# Patient Record
Sex: Female | Born: 1995 | Race: White | Hispanic: No | Marital: Single | Smoking: Never smoker
Health system: Southern US, Community
[De-identification: ages and names within clinical notes are randomized; demographics above are authoritative.]

## PROBLEM LIST (undated history)

## (undated) DIAGNOSIS — F909 Attention-deficit hyperactivity disorder, unspecified type: Secondary | ICD-10-CM

---

## 2018-05-18 ENCOUNTER — Ambulatory Visit (HOSPITAL_COMMUNITY)
Admission: EM | Admit: 2018-05-18 | Discharge: 2018-05-18 | Disposition: A | Discharge status: Home / Self Care | Attending: Family Medicine | Admitting: Family Medicine

## 2018-05-18 ENCOUNTER — Encounter (HOSPITAL_COMMUNITY): Payer: Self-pay | Admitting: Emergency Medicine

## 2018-05-18 DIAGNOSIS — S00412A Abrasion of left ear, initial encounter: Secondary | ICD-10-CM | POA: Diagnosis not present

## 2018-05-18 HISTORY — DX: Attention-deficit hyperactivity disorder, unspecified type: F90.9

## 2018-05-18 NOTE — Discharge Instructions (Signed)
Rest and drink plenty of fluids Avoid cleaning ear for the next week to allow superficial cut to heal Use OTC ibuprofen and/ or tylenol as needed for pain control Follow up with PCP if symptoms persists Return here or go to the ER if you have any new or worsening symptoms ear pain, fever, chills, nausea, vomiting, runny nose, congestion, sore throat, cough, etc..Marland Kitchen

## 2018-05-18 NOTE — ED Provider Notes (Signed)
[facility]   [id number] 05/18/18 Arrival Time: 1153  CC: Blood in ear  SUBJECTIVE: History from: patient.  Megan Marshall is a 23 y.o. female who presents with of blood in left ear that she noticed this morning.  Denies a precipitating event, recent swimming, scuba diving, or traveling.  States she went to clean ear this morning and there was blood on the q-tip.  Denies pain.  Denies aggravating or alleviating factors.  Reports hx of ear infections and tubes in ear. Denies fever, chills, fatigue, sinus pain, rhinorrhea, ear discharge, sore throat, SOB, wheezing, chest pain, nausea, changes in bowel or bladder habits.    ROS: As per HPI.  Past Medical History:  Diagnosis Date  . ADHD    History reviewed. No pertinent surgical history. No Known Allergies No current facility-administered medications on file prior to encounter.    Current Outpatient Medications on File Prior to Encounter  Medication Sig Dispense Refill  . amphetamine-dextroamphetamine (ADDERALL XR) 10 MG 24 hr capsule Take by mouth.    Marland Kitchen. PARoxetine (PAXIL) 10 MG tablet Take by mouth.    . Sertraline HCl (ZOLOFT PO) Take by mouth.    Colleen Can. JUNEL 1.5/30 1.5-30 MG-MCG tablet TK 1 T PO QD    . levocetirizine (XYZAL) 5 MG tablet Take by mouth.     Social History   Socioeconomic History  . Marital status: Single    Spouse name: Not on file  . Number of children: Not on file  . Years of education: Not on file  . Highest education level: Not on file  Occupational History  . Not on file  Social Needs  . Financial resource strain: Not on file  . Food insecurity:    Worry: Not on file    Inability: Not on file  . Transportation needs:    Medical: Not on file    Non-medical: Not on file  Tobacco Use  . Smoking status: Never Smoker  Substance and Sexual Activity  . Alcohol use: Yes  . Drug use: Never  . Sexual activity: Not on file  Lifestyle  . Physical activity:    Days per week: Not on file    Minutes per  session: Not on file  . Stress: Not on file  Relationships  . Social connections:    Talks on phone: Not on file    Gets together: Not on file    Attends religious service: Not on file    Active member of club or organization: Not on file    Attends meetings of clubs or organizations: Not on file    Relationship status: Not on file  . Intimate partner violence:    Fear of current or ex partner: Not on file    Emotionally abused: Not on file    Physically abused: Not on file    Forced sexual activity: Not on file  Other Topics Concern  . Not on file  Social History Narrative  . Not on file   Family History  Problem Relation Age of Onset  . Healthy Mother   . Healthy Father     OBJECTIVE:  Vitals:   05/18/18 1209  BP: 118/83  Pulse: 92  Resp: 18  Temp: 98.3 F (36.8 C)  SpO2: 99%     General appearance: alert; well-appearing HEENT: Ears: RT EAC clear, LT EAC with small superficial abrasion visible in 10-2 o'clock position with scant dried blood, visible prior to otoscopic exam, EAC clear without erythema, swelling or  blood; TMs intact and pearly gray with visible cone of light; Eyes: PERRL, EOMI grossly; Nose: patent without rhinorrhea; Throat: oropharynx clear, tonsils not erythematous or enlarged, uvula midline Neck: supple without LAD Lungs: CTAB Heart: regular rate and rhythm.   Skin: warm and dry Psychological: alert and cooperative; normal mood and affect  ASSESSMENT & PLAN:  1. Abrasion of left ear canal, initial encounter    Rest and drink plenty of fluids Avoid cleaning ear for the next week to allow superficial cut to heal Use OTC ibuprofen and/ or tylenol as needed for pain control Follow up with PCP if symptoms persists Return here or go to the ER if you have any new or worsening symptoms ear pain, fever, chills, nausea, vomiting, runny nose, congestion, sore throat, cough, etc...  Reviewed expectations re: course of current medical issues. Questions  answered. Outlined signs and symptoms indicating need for more acute intervention. Patient verbalized understanding. After Visit Summary given.         Rennis Harding, PA-C 05/18/18 1256

## 2018-05-18 NOTE — ED Triage Notes (Signed)
Pt c/o dried blood in her L ear with ear fullness.

## 2019-06-18 ENCOUNTER — Emergency Department (HOSPITAL_COMMUNITY): Payer: BC Managed Care – PPO

## 2019-06-18 ENCOUNTER — Encounter (HOSPITAL_COMMUNITY): Payer: Self-pay

## 2019-06-18 ENCOUNTER — Emergency Department (HOSPITAL_COMMUNITY)
Admission: EM | Admit: 2019-06-18 | Discharge: 2019-06-18 | Disposition: A | Discharge status: Home / Self Care | Payer: BC Managed Care – PPO | Attending: Emergency Medicine | Admitting: Emergency Medicine

## 2019-06-18 ENCOUNTER — Other Ambulatory Visit: Payer: Self-pay

## 2019-06-18 DIAGNOSIS — R0602 Shortness of breath: Secondary | ICD-10-CM | POA: Diagnosis present

## 2019-06-18 DIAGNOSIS — R0781 Pleurodynia: Secondary | ICD-10-CM | POA: Diagnosis not present

## 2019-06-18 LAB — BASIC METABOLIC PANEL
Anion gap: 7 (ref 5–15)
BUN: 9 mg/dL (ref 6–20)
CO2: 24 mmol/L (ref 22–32)
Calcium: 9 mg/dL (ref 8.9–10.3)
Chloride: 105 mmol/L (ref 98–111)
Creatinine, Ser: 0.91 mg/dL (ref 0.44–1.00)
GFR calc Af Amer: >60 mL/min (ref >60)
GFR calc non Af Amer: >60 mL/min (ref >60)
Glucose, Bld: 100 mg/dL — ABNORMAL HIGH (ref 70–99)
Potassium: 3.7 mmol/L (ref 3.5–5.1)
Sodium: 136 mmol/L (ref 135–145)

## 2019-06-18 LAB — CBC WITH DIFFERENTIAL/PLATELET
Abs Immature Granulocytes: 0.01 10*3/uL (ref 0.00–0.07)
Basophils Absolute: 0 10*3/uL (ref 0.0–0.1)
Basophils Relative: 0 %
Eosinophils Absolute: 0 10*3/uL (ref 0.0–0.5)
Eosinophils Relative: 0 %
HCT: 40.7 % (ref 36.0–46.0)
Hemoglobin: 13.7 g/dL (ref 12.0–15.0)
Immature Granulocytes: 0 %
Lymphocytes Relative: 31 %
Lymphs Abs: 1.8 10*3/uL (ref 0.7–4.0)
MCH: 29.3 pg (ref 26.0–34.0)
MCHC: 33.7 g/dL (ref 30.0–36.0)
MCV: 87.2 fL (ref 80.0–100.0)
Monocytes Absolute: 0.5 10*3/uL (ref 0.1–1.0)
Monocytes Relative: 8 %
Neutro Abs: 3.5 10*3/uL (ref 1.7–7.7)
Neutrophils Relative %: 61 %
Platelets: 215 10*3/uL (ref 150–400)
RBC: 4.67 MIL/uL (ref 3.87–5.11)
RDW: 12.5 % (ref 11.5–15.5)
WBC: 5.8 10*3/uL (ref 4.0–10.5)
nRBC: 0 % (ref 0.0–0.2)

## 2019-06-18 LAB — I-STAT BETA HCG BLOOD, ED (MC, WL, AP ONLY): I-stat hCG, quantitative: <5 m[IU]/mL (ref <5)

## 2019-06-18 MED ORDER — IOHEXOL 350 MG/ML SOLN
100.0000 mL | Freq: Once | INTRAVENOUS | Status: AC | PRN
  Administered 2019-06-18: 100 mL via INTRAVENOUS

## 2019-06-18 MED ORDER — LORAZEPAM 2 MG/ML IJ SOLN: 1.0000 mg | Freq: Once | INTRAMUSCULAR | Status: DC

## 2019-06-18 MED ORDER — SODIUM CHLORIDE (PF) 0.9 % IJ SOLN
INTRAMUSCULAR | Status: AC
  Filled 2019-06-18: qty 50

## 2019-06-18 NOTE — ED Triage Notes (Signed)
Patient arrived stating two weeks ago her provider found a small blood clot in her left leg and she has been on a blood thinner since. States some shortness of breath but today when taking a deep breath she has had a sharp pinching pain under her left breast.

## 2019-06-18 NOTE — ED Provider Notes (Signed)
[facility] DEPT Provider Note   CSN: [id number] Arrival date & time: 06/18/19  2876     History Chief Complaint  Patient presents with  . Shortness of Breath    Megan Marshall is a 24 y.o. female.  HPI   24 year old female with chest pain.  Pleuritic.  Underneath her left breast.  Noticed this morning.  Feels sharp pain and "pinching" with deep and  moderately deep" breathing.  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  She is started on Xarelto.  She reports compliance with it.  She is concerned that she may potentially have a pulmonary embolism.  Mild shortness of breath.  She states that her prior leg pain has improved since being on the Xarelto.  She is on oral contraceptives.  She was not advised to stop these though.  She has an appointment with hematology later in the month.  She also cites a family history of some type of coagulopathy.  Apparently her father has had pulmonary emboli and failed therapy with xarelto.  Past Medical History:  Diagnosis Date  . ADHD     There are no problems to display for this patient.   History reviewed. No pertinent surgical history.   OB History   No obstetric history on file.     Family History  Problem Relation Age of Onset  . Healthy Mother   . Healthy Father     Social History   Tobacco Use  . Smoking status: Never Smoker  Substance Use Topics  . Alcohol use: Yes  . Drug use: Never    Home Medications Prior to Admission medications   Medication Sig Start Date End Date Taking? Authorizing Provider  amphetamine-dextroamphetamine (ADDERALL XR) 25 MG 24 hr capsule Take 20 mg by mouth daily.  05/15/18 06/18/19 Yes [provider]  aspirin-acetaminophen-caffeine (EXCEDRIN MIGRAINE) [phone] MG tablet Take by mouth every 6 (six) hours as needed for headache.   Yes [provider]  Cholecalciferol (VITAMIN D3 PO) Take 1 tablet by mouth  daily.   Yes [provider]  CYANOCOBALAMIN PO Take 1 tablet by mouth daily.   Yes [provider]  desvenlafaxine (PRISTIQ) 50 MG 24 hr tablet Take 50 mg by mouth daily. 04/23/19  Yes [provider]  JUNEL 1.5/30 1.5-30 MG-MCG tablet Take 1 tablet by mouth daily.  05/12/18  Yes [provider]  levocetirizine (XYZAL) 5 MG tablet Take 5 mg by mouth daily.    Yes [provider]  promethazine (PHENERGAN) 25 MG tablet Take 25 mg by mouth every 6 (six) hours as needed for nausea or vomiting.  09/16/18  Yes [provider]  Dollene Primrose PACK 15 & 20 MG TBPK Take 15 mg by mouth in the morning and at bedtime. 06/05/19  Yes [provider]    Allergies    Cat hair extract and Pollen extract  Review of Systems   Review of Systems All systems reviewed and negative, other than as noted in HPI.  Physical Exam Updated Vital Signs BP (!) 135/96 (BP Location: Right Arm)   Pulse 86   Temp 98.4 F (36.9 C) (Oral)   Resp 17   SpO2 96%   Physical Exam Vitals and nursing note reviewed.  Constitutional:      General: She is not in acute distress.    Appearance: She is well-developed.  HENT:     Head: Normocephalic and atraumatic.  Eyes:  General:        Right eye: No discharge.        Left eye: No discharge.     Conjunctiva/sclera: Conjunctivae normal.  Cardiovascular:     Rate and Rhythm: Normal rate and regular rhythm.     Heart sounds: Normal heart sounds. No murmur. No friction rub. No gallop.   Pulmonary:     Effort: Pulmonary effort is normal. No respiratory distress.     Breath sounds: Normal breath sounds.  Abdominal:     General: There is no distension.     Palpations: Abdomen is soft.     Tenderness: There is no abdominal tenderness.  Musculoskeletal:        General: No tenderness.     Cervical back: Neck supple.  Skin:    General: Skin is warm and dry.  Neurological:     Mental Status: She is alert.    Psychiatric:        Behavior: Behavior normal.        Thought Content: Thought content normal.     ED Results / Procedures / Treatments   Labs (all labs ordered are listed, but only abnormal results are displayed) Labs Reviewed  BASIC METABOLIC PANEL - Abnormal; Notable for the following components:      Result Value   Glucose, Bld 100 (*)    All other components within normal limits  CBC WITH DIFFERENTIAL/PLATELET  I-STAT BETA HCG BLOOD, ED ([facility], [facility], [facility] ONLY)    EKG EKG Interpretation  Date/Time:  Wednesday June 18 2019 07:06:18 EST Ventricular Rate:  86 PR Interval:    QRS Duration: 92 QT Interval:  352 QTC Calculation: 421 R Axis:   61 Text Interpretation: Sinus rhythm Confirmed by Raeford Razor [phone]) on 06/18/2019 7:12:13 AM   Radiology CT Angio Chest PE W and/or Wo Contrast  Result Date: 06/18/2019 CLINICAL DATA:  Lower extremity DVT. Left side pleuritic chest pain. EXAM: CT ANGIOGRAPHY CHEST WITH CONTRAST TECHNIQUE: Multidetector CT imaging of the chest was performed using the standard protocol during bolus administration of intravenous contrast. Multiplanar CT image reconstructions and MIPs were obtained to evaluate the vascular anatomy. CONTRAST:  OMNIPAQUE IOHEXOL 350 MG/ML SOLN COMPARISON:  None. FINDINGS: Cardiovascular: No filling defects in the pulmonary arteries to suggest pulmonary emboli. Heart is normal size. Aorta is normal caliber. Mediastinum/Nodes: No mediastinal, hilar, or axillary adenopathy. Trachea and esophagus are unremarkable. Thyroid unremarkable. Lungs/Pleura: Lungs are clear. No focal airspace opacities or suspicious nodules. No effusions. Upper Abdomen: Imaging into the upper abdomen shows no acute findings. Musculoskeletal: Chest wall soft tissues are unremarkable. No acute bony abnormality. Review of the MIP images confirms the above findings. IMPRESSION: No evidence of pulmonary embolus. No acute cardiopulmonary disease. Electronically  Signed   By: Charlett Nose M.D.   On: 06/18/2019 08:48    Procedures Procedures (including critical care time)  Medications Ordered in ED Medications  LORazepam (ATIVAN) injection 1 mg (has no administration in time range)  sodium chloride (PF) 0.9 % injection (has no administration in time range)  iohexol (OMNIPAQUE) 350 MG/ML injection 100 mL (100 mLs Intravenous Contrast Given 06/18/19 0829)    ED Course  I have reviewed the triage vital signs and the nursing notes.  Pertinent labs & imaging results that were available during my care of the patient were reviewed by me and considered in my medical decision making (see chart for details).    MDM Rules/Calculators/A&P  24 year old female with pleuritic sounding left chest pain and mild dyspnea in the setting of DVT diagnosed 2 weeks ago.  She has been on Xarelto and reports compliance.  She looks well.  Lungs sound clear.  Vital signs are generally reassuring.  I am not sure why she was not advised to stop her oral contraceptive.  Sounds like she has a significant family history of coagulopathy although she is unsure of the specifics.  She is yet to see hematology yet.  She should be theoretically treated with the Xarelto but, given this history, will CTA.  Final Clinical Impression(s) / ED Diagnoses Final diagnoses:  Pleuritic chest pain    Rx / DC Orders ED Discharge Orders    None       Raeford Razor, MD 06/18/19 [phone]

## 2019-06-18 NOTE — Discharge Instructions (Addendum)
You do not have a pulmonary embolism. Your CT is fine. I don't have an exact etiology of your chest pain, but I have a low suspicion for an emergent process. Oral contraceptives are generally contraindicated with a history of DVT. I would stop taking it until you can discuss further with hematology.

## 2019-06-18 NOTE — ED Notes (Signed)
Patient ambulated to CT

## 2019-06-18 NOTE — ED Notes (Signed)
ED Provider at bedside. 

## 2020-06-10 ENCOUNTER — Emergency Department (HOSPITAL_COMMUNITY)
Admission: EM | Admit: 2020-06-10 | Discharge: 2020-06-11 | Disposition: A | Discharge status: Home / Self Care | Payer: BC Managed Care – PPO | Attending: Emergency Medicine | Admitting: Emergency Medicine

## 2020-06-10 ENCOUNTER — Other Ambulatory Visit: Payer: Self-pay

## 2020-06-10 ENCOUNTER — Encounter (HOSPITAL_COMMUNITY): Payer: Self-pay | Admitting: Emergency Medicine

## 2020-06-10 DIAGNOSIS — Z7901 Long term (current) use of anticoagulants: Secondary | ICD-10-CM | POA: Insufficient documentation

## 2020-06-10 DIAGNOSIS — M79604 Pain in right leg: Secondary | ICD-10-CM

## 2020-06-10 DIAGNOSIS — Z86718 Personal history of other venous thrombosis and embolism: Secondary | ICD-10-CM | POA: Diagnosis not present

## 2020-06-10 DIAGNOSIS — M79605 Pain in left leg: Secondary | ICD-10-CM

## 2020-06-10 DIAGNOSIS — M79662 Pain in left lower leg: Secondary | ICD-10-CM | POA: Diagnosis not present

## 2020-06-10 DIAGNOSIS — M79661 Pain in right lower leg: Secondary | ICD-10-CM | POA: Diagnosis present

## 2020-06-10 LAB — BASIC METABOLIC PANEL
Anion gap: 10 (ref 5–15)
BUN: 9 mg/dL (ref 6–20)
CO2: 24 mmol/L (ref 22–32)
Calcium: 9.4 mg/dL (ref 8.9–10.3)
Chloride: 104 mmol/L (ref 98–111)
Creatinine, Ser: 0.88 mg/dL (ref 0.44–1.00)
GFR, Estimated: >60 mL/min (ref >60)
Glucose, Bld: 87 mg/dL (ref 70–99)
Potassium: 3.6 mmol/L (ref 3.5–5.1)
Sodium: 138 mmol/L (ref 135–145)

## 2020-06-10 LAB — CBC
HCT: 41.8 % (ref 36.0–46.0)
Hemoglobin: 13.5 g/dL (ref 12.0–15.0)
MCH: 28.7 pg (ref 26.0–34.0)
MCHC: 32.3 g/dL (ref 30.0–36.0)
MCV: 88.7 fL (ref 80.0–100.0)
Platelets: 233 10*3/uL (ref 150–400)
RBC: 4.71 MIL/uL (ref 3.87–5.11)
RDW: 12.2 % (ref 11.5–15.5)
WBC: 7.2 10*3/uL (ref 4.0–10.5)
nRBC: 0 % (ref 0.0–0.2)

## 2020-06-10 NOTE — ED Triage Notes (Signed)
Pt reports history of DVT, c/o bilateral leg soreness and states her lower legs are cool to the touch. States she has stopped taking her blood thinners. Denies chest pain/shortness of breath.

## 2020-06-11 ENCOUNTER — Ambulatory Visit (HOSPITAL_BASED_OUTPATIENT_CLINIC_OR_DEPARTMENT_OTHER)
Admission: RE | Admit: 2020-06-11 | Discharge: 2020-06-11 | Disposition: A | Discharge status: Home / Self Care | Payer: BC Managed Care – PPO | Source: Ambulatory Visit | Attending: Emergency Medicine | Admitting: Emergency Medicine

## 2020-06-11 DIAGNOSIS — M79604 Pain in right leg: Secondary | ICD-10-CM

## 2020-06-11 DIAGNOSIS — M79605 Pain in left leg: Secondary | ICD-10-CM

## 2020-06-11 MED ORDER — ENOXAPARIN SODIUM 80 MG/0.8ML ~~LOC~~ SOLN
1.0000 mg/kg | Freq: Once | SUBCUTANEOUS | Status: AC
  Administered 2020-06-11: 70 mg via SUBCUTANEOUS
  Filled 2020-06-11: qty 0.7

## 2020-06-11 NOTE — Progress Notes (Signed)
Bilateral lower extremity venous study completed.      Please see CV Proc for preliminary results.   Edinson Domeier, RVT  

## 2020-06-11 NOTE — ED Provider Notes (Signed)
[facility] EMERGENCY DEPARTMENT Provider Note  CSN: [id number] Arrival date & time: 06/10/20 1839  Chief Complaint(s) Leg Pain  HPI Megan Marshall is a 25 y.o. female   The history is provided by the patient.  Leg Pain Location:  Leg Time since incident:  2 days Leg location:  L lower leg and R lower leg Pain details:    Quality:  Aching   Severity:  Mild   Onset quality:  Gradual   Timing:  Constant   Progression:  Worsening Relieved by:  Nothing Worsened by:  Nothing Associated symptoms: no back pain, no numbness, no swelling and no tingling   Associated symptoms comment:  "they feel cold" Risk factors comment:  Prior DVT. has been off of Xarelto for several months. H/o Protein S def.   Past Medical History Past Medical History:  Diagnosis Date  . ADHD    There are no problems to display for this patient.  Home Medication(s) Prior to Admission medications   Medication Sig Start Date End Date Taking? Authorizing Provider  amphetamine-dextroamphetamine (ADDERALL XR) 25 MG 24 hr capsule Take 20 mg by mouth daily.  05/15/18 06/18/19  [provider]  aspirin-acetaminophen-caffeine (EXCEDRIN MIGRAINE) [phone] MG tablet Take by mouth every 6 (six) hours as needed for headache.    [provider]  Cholecalciferol (VITAMIN D3 PO) Take 1 tablet by mouth daily.    [provider]  CYANOCOBALAMIN PO Take 1 tablet by mouth daily.    [provider]  desvenlafaxine (PRISTIQ) 50 MG 24 hr tablet Take 50 mg by mouth daily. 04/23/19   [provider]  JUNEL 1.5/30 1.5-30 MG-MCG tablet Take 1 tablet by mouth daily.  05/12/18   [provider]  levocetirizine (XYZAL) 5 MG tablet Take 5 mg by mouth daily.     [provider]  promethazine (PHENERGAN) 25 MG tablet Take 25 mg by mouth every 6 (six) hours as needed for nausea or vomiting.  09/16/18   [provider]  XARELTO STARTER PACK 15 & 20 MG TBPK Take 15  mg by mouth in the morning and at bedtime. 06/05/19   [provider]                                                                                                                                    Past Surgical History History reviewed. No pertinent surgical history. Family History Family History  Problem Relation Age of Onset  . Healthy Mother   . Healthy Father     Social History Social History   Tobacco Use  . Smoking status: Never Smoker  Substance Use Topics  . Alcohol use: Yes  . Drug use: Never   Allergies Cat hair extract and Pollen extract  Review of Systems Review of Systems  Musculoskeletal: Negative for back pain.   All other systems are reviewed and are negative for acute change except  as noted in the HPI  Physical Exam Vital Signs  I have reviewed the triage vital signs BP (!) 125/94 (BP Location: Right Arm)   Pulse 62   Temp 98.7 F (37.1 C) (Oral)   Resp 19   Ht 5\' 8"  (1.727 m)   Wt 68 kg   SpO2 100%   BMI 22.81 kg/m   Physical Exam Vitals reviewed.  Constitutional:      General: She is not in acute distress.    Appearance: She is well-developed and well-nourished. She is not diaphoretic.  HENT:     Head: Normocephalic and atraumatic.     Right Ear: External ear normal.     Left Ear: External ear normal.     Nose: Nose normal.  Eyes:     General: No scleral icterus.    Extraocular Movements: EOM normal.     Conjunctiva/sclera: Conjunctivae normal.  Neck:     Trachea: Phonation normal.  Cardiovascular:     Rate and Rhythm: Normal rate and regular rhythm.  Pulmonary:     Effort: Pulmonary effort is normal. No respiratory distress.     Breath sounds: No stridor.  Abdominal:     General: There is no distension.  Musculoskeletal:        General: No edema. Normal range of motion.     Cervical back: Normal range of motion.     Right lower leg: No swelling or tenderness. No edema.     Left lower leg: No swelling or tenderness.  No edema.     Right foot: Normal capillary refill. Normal pulse.     Left foot: Normal capillary refill. Normal pulse.     Comments: Feet cold to the touch  Neurological:     Mental Status: She is alert and oriented to person, place, and time.  Psychiatric:        Mood and Affect: Mood and affect normal.        Behavior: Behavior normal.     ED Results and Treatments Labs (all labs ordered are listed, but only abnormal results are displayed) Labs Reviewed  BASIC METABOLIC PANEL  CBC                                                                                                                         EKG  EKG Interpretation  Date/Time:  Thursday June 10 2020 18:50:44 EST Ventricular Rate:  95 PR Interval:  124 QRS Duration: 84 QT Interval:  342 QTC Calculation: 429 R Axis:   70 Text Interpretation: Normal sinus rhythm Normal ECG No acute changes Confirmed by 04-04[birth year]1996 [phone]) on 06/10/2020 11:52:58 PM      Radiology No results found.  Pertinent labs & imaging results that were available during my care of the patient were reviewed by me and considered in my medical decision making (see chart for details).  Medications Ordered in ED Medications  enoxaparin (LOVENOX) injection 70 mg (has no administration in time range)  Procedures Procedures  (including critical care time)  Medical Decision Making / ED Course I have reviewed the nursing notes for this encounter and the patient's prior records (if available in EHR or on provided paperwork).   Megan Marshall was evaluated in Emergency Department on 06/11/2020 for the symptoms described in the history of present illness. She was evaluated in the context of the global COVID-19 pandemic, which necessitated consideration that the patient might be at risk for infection with the SARS-CoV-2 virus  that causes COVID-19. Institutional protocols and algorithms that pertain to the evaluation of patients at risk for COVID-19 are in a state of rapid change based on information released by regulatory bodies including the CDC and federal and state organizations. These policies and algorithms were followed during the patient's care in the ED.  Bilateral leg pain. H/o Protein S def and prior DVT not on Xarelto. Pulses intact. No edema or swelling. 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 No [place] at this time of night.  Lovenox given. Return in the am for [place].      Final Clinical Impression(s) / ED Diagnoses Final diagnoses:  Bilateral leg pain   The patient appears reasonably screened and/or stabilized for discharge and I doubt any other medical condition or other [facility] requiring further screening, evaluation, or treatment in the ED at this time prior to discharge. Safe for discharge with strict return precautions.  Disposition: Discharge  Condition: Good  I have discussed the results, Dx and Tx plan with the patient/family who expressed understanding and agree(s) with the plan. Discharge instructions discussed at length. The patient/family was given strict return precautions who verbalized understanding of the instructions. No further questions at time of discharge.    ED Discharge Orders         Ordered    [place]        06/11/20 0012            Follow Up: Georganna Skeans, MD [address] [place] [address] [phone]  Call  as needed      This chart was dictated using voice recognition software.  Despite best efforts to proofread,  errors can occur which can change the documentation meaning.   Nira Conn, MD 06/11/20 [phone]

## 2021-04-24 IMAGING — CT CT ANGIO CHEST
2 of 6 series · 18 of 36 positions shown · IV contrast (OMNIPAQUE 350)
Comparison: None.

CLINICAL DATA: Lower extremity DVT. Left side pleuritic chest pain.

EXAM:
CT ANGIOGRAPHY CHEST WITH CONTRAST
TECHNIQUE: Multidetector CT imaging of the chest was performed using the
standard protocol during bolus administration of intravenous
contrast. Multiplanar CT image reconstructions and MIPs were
obtained to evaluate the vascular anatomy.
CONTRAST:  100mL OMNIPAQUE IOHEXOL 350 MG/ML SOLN

[Series 5: thins · axial · 0.68mm/px · z∈[+1424,+1651]mm · 17 of 257 slices shown]
[im 15/257  lung]
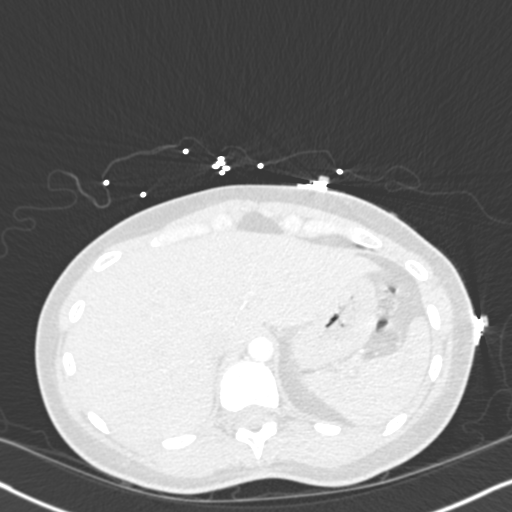
[im 29/257  mediastinal]
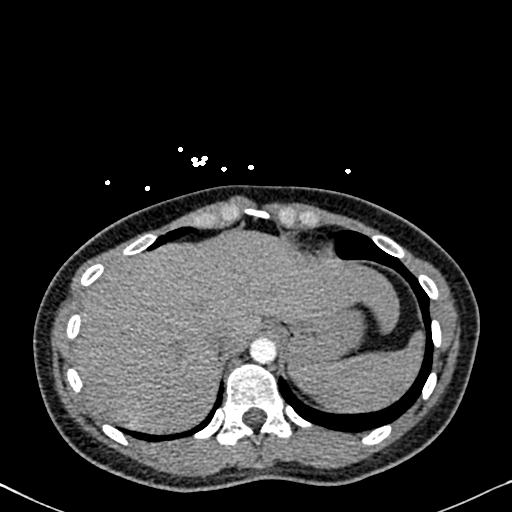
[im 43/257  lung]
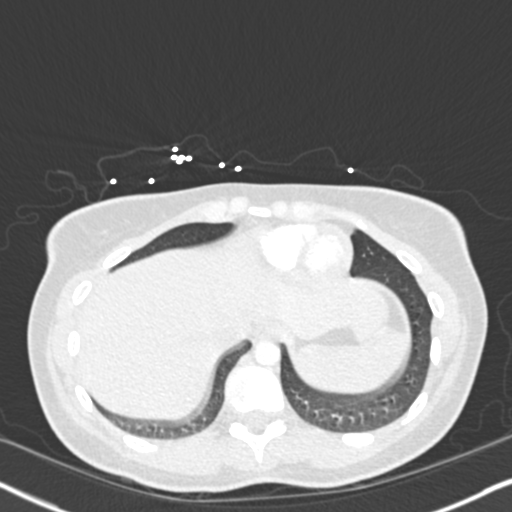
[im 57/257  mediastinal]
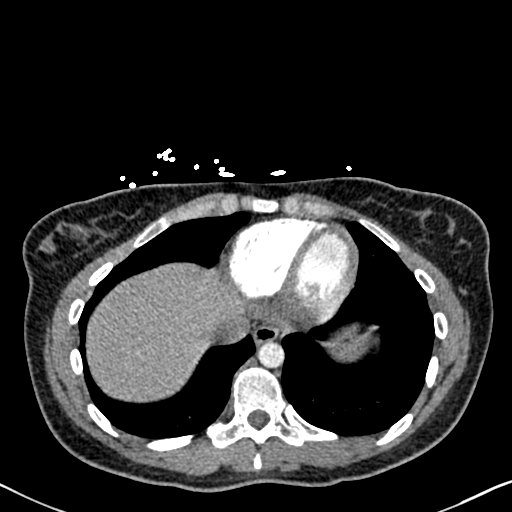
[im 72/257  lung]
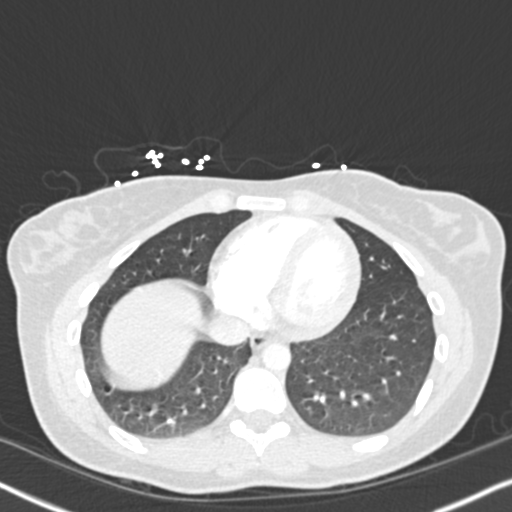
[im 86/257  mediastinal]
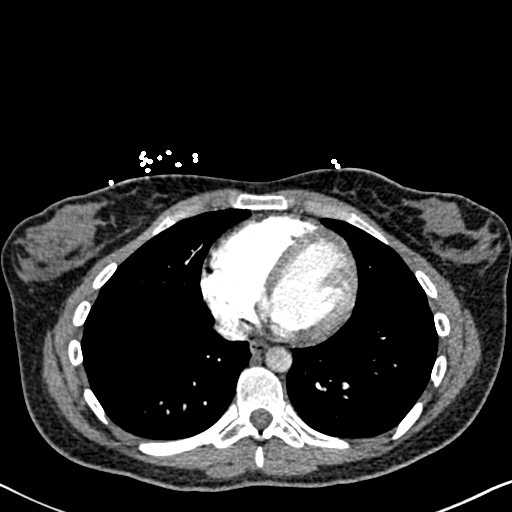
[im 100/257  lung]
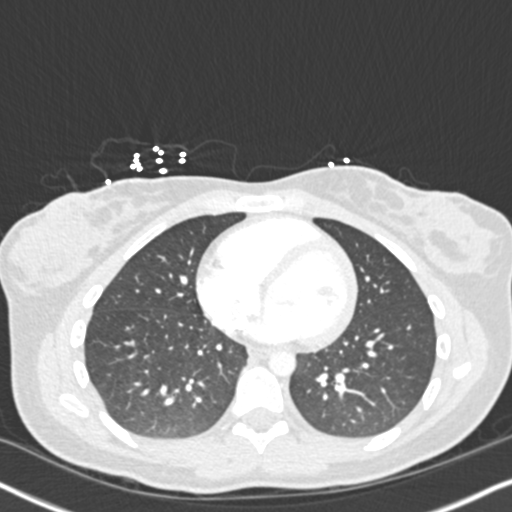
[im 114/257  mediastinal]
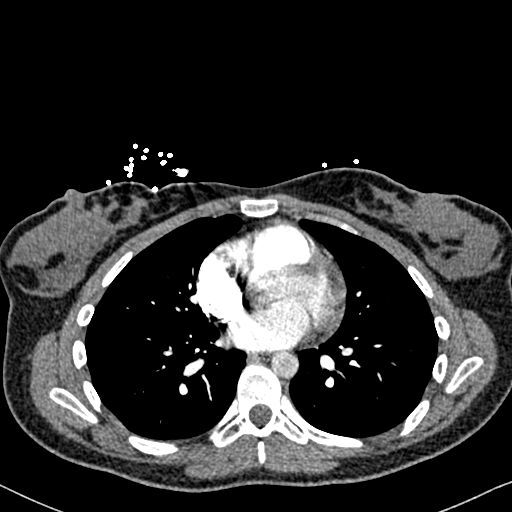
[im 129/257  lung]
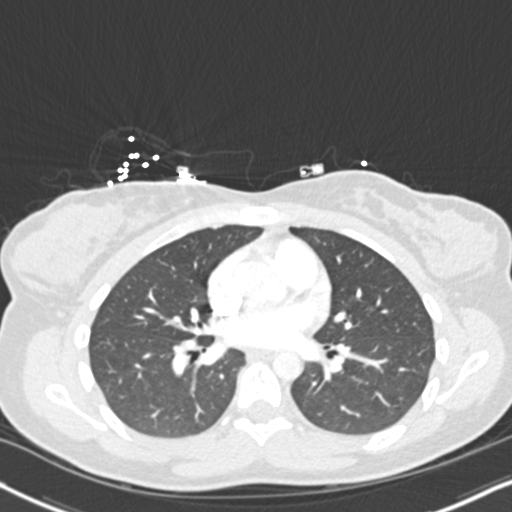
[im 143/257  mediastinal]
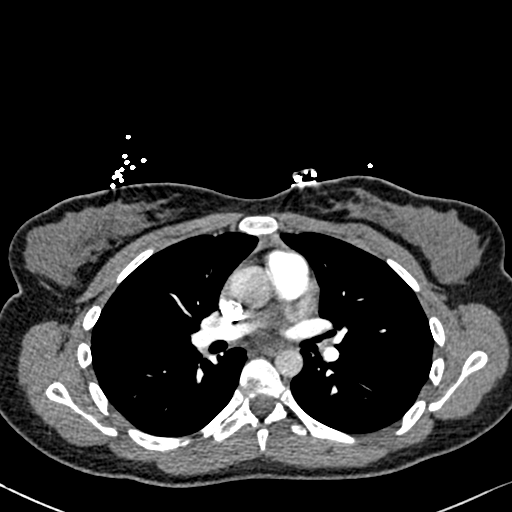
[im 157/257  lung]
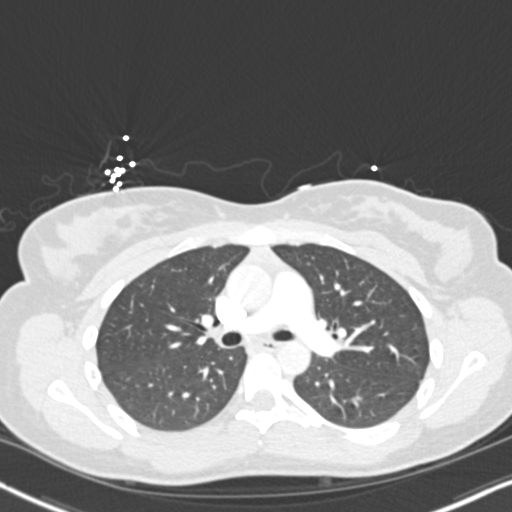
[im 171/257  mediastinal]
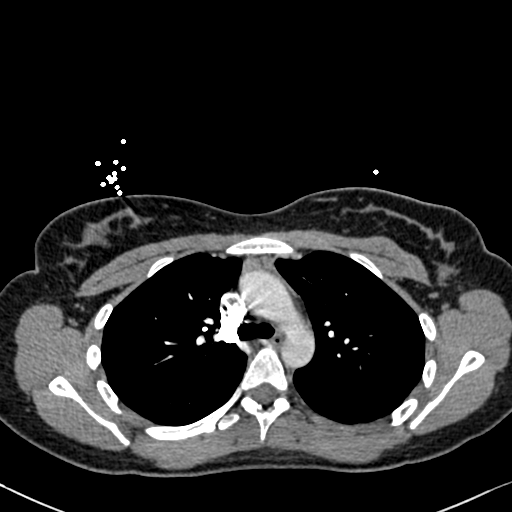
[im 185/257  lung]
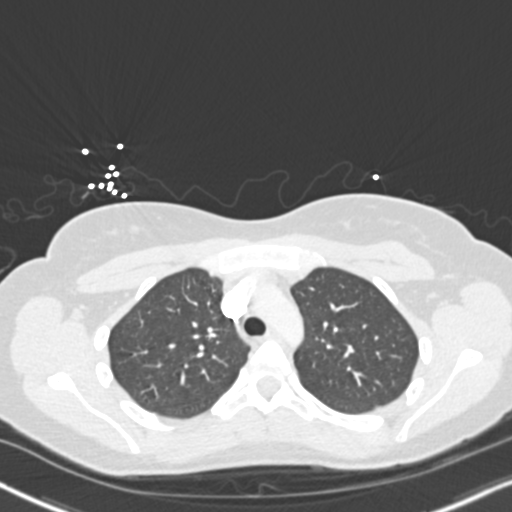
[im 200/257  mediastinal]
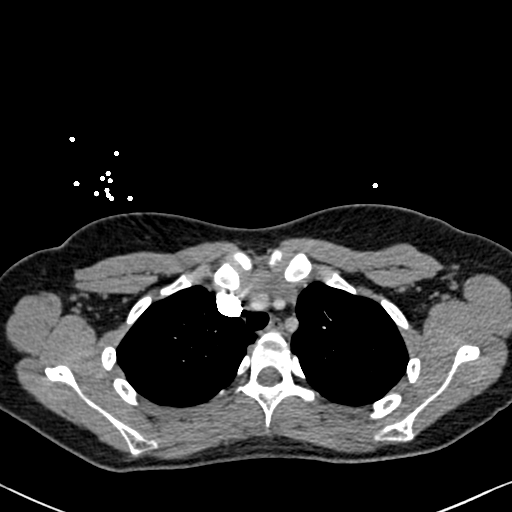
[im 214/257  lung]
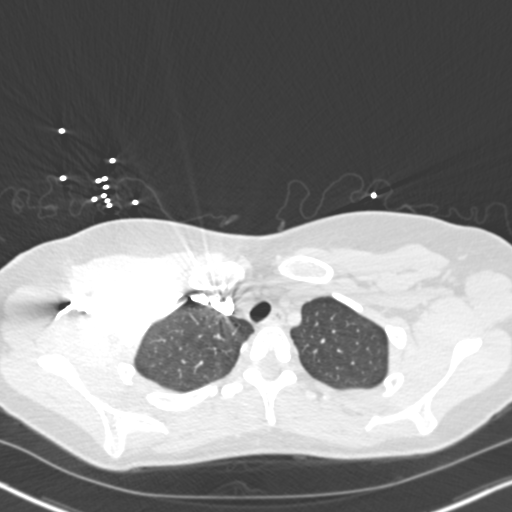
[im 228/257  mediastinal]
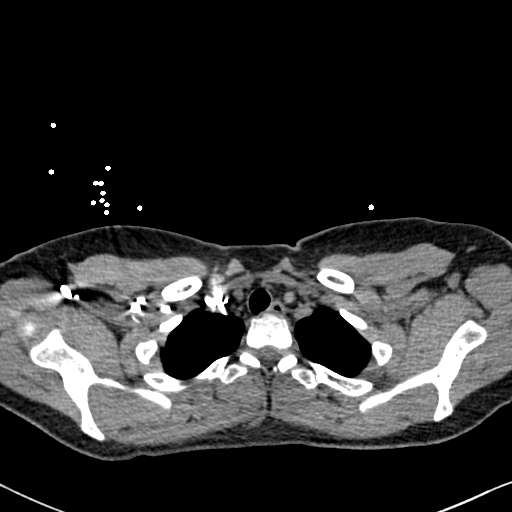
[im 242/257  lung]
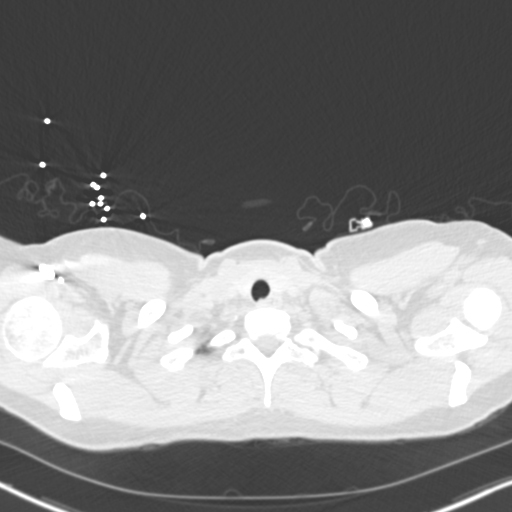

[Series 6: coronal mpr · coronal · 0.54mm/px · 1 of 89 slices shown]
[im 45/89  mediastinal]
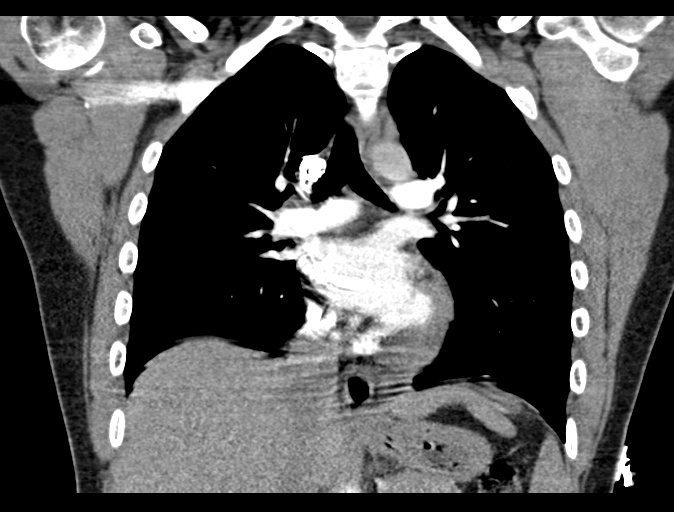

[18 of 36 positions shown; findings below may reference images not displayed]

FINDINGS: Cardiovascular: No filling defects in the pulmonary arteries to
suggest pulmonary emboli. Heart is normal size. Aorta is normal
caliber.

Mediastinum/Nodes: No mediastinal, hilar, or axillary adenopathy.
Trachea and esophagus are unremarkable. Thyroid unremarkable.

Lungs/Pleura: Lungs are clear. No focal airspace opacities or
suspicious nodules. No effusions.

Upper Abdomen: Imaging into the upper abdomen shows no acute
findings.

Musculoskeletal: Chest wall soft tissues are unremarkable. No acute
bony abnormality.

Review of the MIP images confirms the above findings.
IMPRESSION: No evidence of pulmonary embolus.

No acute cardiopulmonary disease.
